# Patient Record
Sex: Male | Born: 1950 | Hispanic: Yes | Marital: Married | Smoking: Current some day smoker
Health system: Southern US, Community
[De-identification: ages and names within clinical notes are randomized; demographics above are authoritative.]

---

## 2005-01-19 ENCOUNTER — Emergency Department (HOSPITAL_COMMUNITY): Admission: EM | Admit: 2005-01-19 | Discharge: 2005-01-19 | Payer: Self-pay | Admitting: Emergency Medicine

## 2016-08-08 ENCOUNTER — Inpatient Hospital Stay (HOSPITAL_COMMUNITY)
Admission: EM | Admit: 2016-08-08 | Discharge: 2016-08-09 | DRG: 395 | Disposition: A | Discharge status: Home / Self Care | Payer: Self-pay | Attending: Surgery | Admitting: Surgery

## 2016-08-08 ENCOUNTER — Encounter (HOSPITAL_COMMUNITY): Payer: Self-pay

## 2016-08-08 ENCOUNTER — Emergency Department (HOSPITAL_COMMUNITY): Payer: Self-pay

## 2016-08-08 DIAGNOSIS — Z880 Allergy status to penicillin: Secondary | ICD-10-CM

## 2016-08-08 DIAGNOSIS — T185XXA Foreign body in anus and rectum, initial encounter: Principal | ICD-10-CM | POA: Diagnosis present

## 2016-08-08 DIAGNOSIS — F17210 Nicotine dependence, cigarettes, uncomplicated: Secondary | ICD-10-CM | POA: Diagnosis present
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
## 2016-08-08 NOTE — ED Notes (Signed)
Patient transported to X-ray 
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
## 2016-08-09 ENCOUNTER — Emergency Department (HOSPITAL_COMMUNITY): Payer: Self-pay | Admitting: Anesthesiology

## 2016-08-09 ENCOUNTER — Encounter (HOSPITAL_COMMUNITY): Admission: EM | Disposition: A | Discharge status: Home / Self Care | Payer: Self-pay | Source: Home / Self Care

## 2016-08-09 ENCOUNTER — Encounter (HOSPITAL_COMMUNITY): Payer: Self-pay | Admitting: Anesthesiology

## 2016-08-09 DIAGNOSIS — T185XXA Foreign body in anus and rectum, initial encounter: Secondary | ICD-10-CM | POA: Diagnosis present

## 2016-08-09 HISTORY — PX: EVALUATION UNDER ANESTHESIA WITH ANAL FISTULECTOMY: SHX5621

## 2016-08-09 LAB — BASIC METABOLIC PANEL
Anion gap: 11 (ref 5–15)
CO2: 22 mmol/L (ref 22–32)
CREATININE: 0.82 mg/dL (ref 0.61–1.24)
Calcium: 9.3 mg/dL (ref 8.9–10.3)
Chloride: 105 mmol/L (ref 101–111)
Glucose, Bld: 120 mg/dL — ABNORMAL HIGH (ref 65–99)
Potassium: 3.8 mmol/L (ref 3.5–5.1)
SODIUM: 138 mmol/L (ref 135–145)

## 2016-08-09 LAB — CBC WITH DIFFERENTIAL/PLATELET
BASOS PCT: 0 %
Basophils Absolute: 0 10*3/uL (ref 0.0–0.1)
EOS ABS: 0 10*3/uL (ref 0.0–0.7)
EOS PCT: 0 %
HCT: 44.7 % (ref 39.0–52.0)
Hemoglobin: 15.6 g/dL (ref 13.0–17.0)
LYMPHS ABS: 1 10*3/uL (ref 0.7–4.0)
Lymphocytes Relative: 9 %
MCH: 32.5 pg (ref 26.0–34.0)
MCHC: 34.9 g/dL (ref 30.0–36.0)
MCV: 93.1 fL (ref 78.0–100.0)
MONOS PCT: 5 %
Monocytes Absolute: 0.5 10*3/uL (ref 0.1–1.0)
Neutro Abs: 9.9 10*3/uL — ABNORMAL HIGH (ref 1.7–7.7)
Neutrophils Relative %: 86 %
PLATELETS: 295 10*3/uL (ref 150–400)
RBC: 4.8 MIL/uL (ref 4.22–5.81)
RDW: 12.3 % (ref 11.5–15.5)
WBC: 11.4 10*3/uL — AB (ref 4.0–10.5)

## 2016-08-09 SURGERY — EXAM UNDER ANESTHESIA WITH ANAL FISTULECTOMY
Anesthesia: General | Site: Rectum

## 2016-08-09 MED ORDER — MIDAZOLAM HCL 2 MG/2ML IJ SOLN
INTRAMUSCULAR | Status: AC
  Filled 2016-08-09: qty 2

## 2016-08-09 MED ORDER — KCL IN DEXTROSE-NACL 20-5-0.9 MEQ/L-%-% IV SOLN
INTRAVENOUS | Status: DC
Start: 2016-08-09 — End: 2016-08-09
  Administered 2016-08-09: 03:00:00 via INTRAVENOUS
  Filled 2016-08-09 (×2): qty 1000

## 2016-08-09 MED ORDER — LIDOCAINE HCL (CARDIAC) 20 MG/ML IV SOLN
INTRAVENOUS | Status: DC | PRN
Start: 2016-08-09 — End: 2016-08-09
  Administered 2016-08-09: 100 mg via INTRATRACHEAL

## 2016-08-09 MED ORDER — ENOXAPARIN SODIUM 40 MG/0.4ML ~~LOC~~ SOLN: 40.0000 mg | SUBCUTANEOUS | Status: DC

## 2016-08-09 MED ORDER — SIMETHICONE 80 MG PO CHEW: 40.0000 mg | CHEWABLE_TABLET | Freq: Four times a day (QID) | ORAL | Status: DC | PRN

## 2016-08-09 MED ORDER — SODIUM CHLORIDE 0.9 % IV BOLUS (SEPSIS)
1000.0000 mL | Freq: Once | INTRAVENOUS | Status: AC
Start: 2016-08-09 — End: 2016-08-09
  Administered 2016-08-09: 1000 mL via INTRAVENOUS

## 2016-08-09 MED ORDER — SODIUM CHLORIDE 0.9 % IV SOLN
INTRAVENOUS | Status: DC | PRN
  Administered 2016-08-09: 01:00:00 via INTRAVENOUS

## 2016-08-09 MED ORDER — ONDANSETRON HCL 4 MG/2ML IJ SOLN: 4.0000 mg | Freq: Four times a day (QID) | INTRAMUSCULAR | Status: DC | PRN

## 2016-08-09 MED ORDER — ONDANSETRON 4 MG PO TBDP: 4.0000 mg | ORAL_TABLET | Freq: Four times a day (QID) | ORAL | Status: DC | PRN

## 2016-08-09 MED ORDER — HYDROCODONE-ACETAMINOPHEN 7.5-325 MG PO TABS: 1.0000 | ORAL_TABLET | Freq: Once | ORAL | Status: DC | PRN

## 2016-08-09 MED ORDER — PROPOFOL 10 MG/ML IV BOLUS
INTRAVENOUS | Status: DC | PRN
  Administered 2016-08-09: 160 mg via INTRAVENOUS
  Administered 2016-08-09: 50 mg via INTRAVENOUS

## 2016-08-09 MED ORDER — FENTANYL CITRATE (PF) 100 MCG/2ML IJ SOLN
INTRAMUSCULAR | Status: AC
Start: 2016-08-09 — End: 2016-08-09
  Filled 2016-08-09: qty 2

## 2016-08-09 MED ORDER — HYDROMORPHONE HCL 1 MG/ML IJ SOLN: 0.2500 mg | INTRAMUSCULAR | Status: DC | PRN

## 2016-08-09 MED ORDER — OXYCODONE HCL 5 MG PO TABS: 5.0000 mg | ORAL_TABLET | ORAL | Status: DC | PRN

## 2016-08-09 MED ORDER — IBUPROFEN 600 MG PO TABS: 600.0000 mg | ORAL_TABLET | Freq: Four times a day (QID) | ORAL | Status: DC | PRN

## 2016-08-09 MED ORDER — SODIUM CHLORIDE 0.9 % IV SOLN
500.0000 mg | Freq: Once | INTRAVENOUS | Status: AC
  Administered 2016-08-09: 500 mg via INTRAVENOUS
  Filled 2016-08-09: qty 500

## 2016-08-09 MED ORDER — SUCCINYLCHOLINE CHLORIDE 20 MG/ML IJ SOLN
INTRAMUSCULAR | Status: DC | PRN
  Administered 2016-08-09: 80 mg via INTRAVENOUS

## 2016-08-09 MED ORDER — ONDANSETRON HCL 4 MG/2ML IJ SOLN
4.0000 mg | Freq: Once | INTRAMUSCULAR | Status: DC
Start: 2016-08-09 — End: 2016-08-09

## 2016-08-09 MED ORDER — PROPOFOL 10 MG/ML IV BOLUS
INTRAVENOUS | Status: AC
  Filled 2016-08-09: qty 20

## 2016-08-09 MED ORDER — KCL IN DEXTROSE-NACL 20-5-0.9 MEQ/L-%-% IV SOLN
INTRAVENOUS | Status: DC
  Administered 2016-08-09: 02:00:00 via INTRAVENOUS
  Filled 2016-08-09 (×3): qty 1000

## 2016-08-09 MED ORDER — ONDANSETRON HCL 4 MG/2ML IJ SOLN: 4.0000 mg | Freq: Once | INTRAMUSCULAR | Status: DC | PRN

## 2016-08-09 MED ORDER — FENTANYL CITRATE (PF) 250 MCG/5ML IJ SOLN
INTRAMUSCULAR | Status: DC | PRN
  Administered 2016-08-09 (×2): 50 ug via INTRAVENOUS

## 2016-08-09 SURGICAL SUPPLY — 37 items
CANISTER SUCTION 2500CC (MISCELLANEOUS) ×3 IMPLANT
COVER SURGICAL LIGHT HANDLE (MISCELLANEOUS) ×3 IMPLANT
DRAPE UTILITY XL STRL (DRAPES) ×2 IMPLANT
DRSG PAD ABDOMINAL 8X10 ST (GAUZE/BANDAGES/DRESSINGS) ×1 IMPLANT
ELECT CAUTERY BLADE 6.4 (BLADE) ×1 IMPLANT
ELECT REM PT RETURN 9FT ADLT (ELECTROSURGICAL)
ELECTRODE REM PT RTRN 9FT ADLT (ELECTROSURGICAL) ×1 IMPLANT
GAUZE SPONGE 4X4 12PLY STRL (GAUZE/BANDAGES/DRESSINGS) ×1 IMPLANT
GAUZE SPONGE 4X4 16PLY XRAY LF (GAUZE/BANDAGES/DRESSINGS) ×1 IMPLANT
GEL ULTRASOUND 20GR AQUASONIC (MISCELLANEOUS) ×1 IMPLANT
GLOVE BIO SURGEON STRL SZ8 (GLOVE) ×3 IMPLANT
GLOVE BIOGEL PI IND STRL 8 (GLOVE) ×1 IMPLANT
GLOVE BIOGEL PI INDICATOR 8 (GLOVE) ×2
GOWN STRL REUS W/ TWL LRG LVL3 (GOWN DISPOSABLE) ×2 IMPLANT
GOWN STRL REUS W/ TWL XL LVL3 (GOWN DISPOSABLE) ×1 IMPLANT
GOWN STRL REUS W/TWL LRG LVL3 (GOWN DISPOSABLE) ×3
GOWN STRL REUS W/TWL XL LVL3 (GOWN DISPOSABLE) ×3
KIT BASIN OR (CUSTOM PROCEDURE TRAY) ×3 IMPLANT
KIT ROOM TURNOVER OR (KITS) ×3 IMPLANT
NDL HYPO 25GX1X1/2 BEV (NEEDLE) ×1 IMPLANT
NEEDLE HYPO 25GX1X1/2 BEV (NEEDLE) IMPLANT
NS IRRIG 1000ML POUR BTL (IV SOLUTION) ×1 IMPLANT
PACK LITHOTOMY IV (CUSTOM PROCEDURE TRAY) ×3 IMPLANT
PAD ARMBOARD 7.5X6 YLW CONV (MISCELLANEOUS) ×6 IMPLANT
PENCIL BUTTON HOLSTER BLD 10FT (ELECTRODE) ×1 IMPLANT
SURGILUBE 2OZ TUBE FLIPTOP (MISCELLANEOUS) ×3 IMPLANT
SUT CHROMIC 2 0 SH (SUTURE) IMPLANT
SYR BULB 3OZ (MISCELLANEOUS) ×1 IMPLANT
SYR CONTROL 10ML LL (SYRINGE) ×1 IMPLANT
TOWEL OR 17X24 6PK STRL BLUE (TOWEL DISPOSABLE) ×3 IMPLANT
TOWEL OR 17X26 10 PK STRL BLUE (TOWEL DISPOSABLE) ×3 IMPLANT
TRAY PROCTOSCOPIC FIBER OPTIC (SET/KITS/TRAYS/PACK) ×2 IMPLANT
TUBE CONNECTING 12'X1/4 (SUCTIONS) ×1
TUBE CONNECTING 12X1/4 (SUCTIONS) ×2 IMPLANT
UNDERPAD 30X30 (UNDERPADS AND DIAPERS) ×1 IMPLANT
WATER STERILE IRR 1000ML POUR (IV SOLUTION) IMPLANT
YANKAUER SUCT BULB TIP NO VENT (SUCTIONS) ×3 IMPLANT

## 2016-08-09 NOTE — Anesthesia Procedure Notes (Signed)
Procedure Name: Intubation Date/Time: 08/09/2016 1:28 AM Performed by: Brien MatesMAHONY, Kentavious Michele D Pre-anesthesia Checklist: Patient identified, Emergency Drugs available, Suction available, Patient being monitored and Timeout performed Patient Re-evaluated:Patient Re-evaluated prior to inductionOxygen Delivery Method: Circle system utilized Preoxygenation: Pre-oxygenation with 100% oxygen Intubation Type: IV induction, Rapid sequence and Cricoid Pressure applied Laryngoscope Size: Miller and 2 Grade View: Grade I Tube type: Oral Tube size: 7.5 mm Number of attempts: 1 Airway Equipment and Method: Stylet Placement Confirmation: ETT inserted through vocal cords under direct vision,  positive ETCO2 and breath sounds checked- equal and bilateral Secured at: 22 cm Tube secured with: Tape Dental Injury: Teeth and Oropharynx as per pre-operative assessment

## 2016-08-09 NOTE — Discharge Summary (Signed)
Physician Discharge Summary  Patient ID: Clovis FredricksonJose Saephan MRN: [id number] DOB/AGE: 67-May-1952 67 y.o.  Admit date: 08/08/2016 Discharge date: 08/09/2016  Admission Diagnoses:  Discharge Diagnoses:  Active Problems:   Rectal foreign body   Foreign body anus/rectum   Discharged Condition: good  Hospital Course: uneventful post op recovery.  Discharged pod#1  Consults: None  Significant Diagnostic Studies:   Treatments: surgery: removal foreign body from rectum  Discharge Exam: Blood pressure (!) 126/59, pulse 73, temperature 97.8 F (36.6 C), temperature source Oral, resp. rate 16, weight 75.2 kg (165 lb 12.8 oz), SpO2 97 %. General appearance: alert, cooperative and no distress Resp: clear to auscultation bilaterally Cardio: regular rate and rhythm, S1, S2 normal, no murmur, click, rub or gallop GI: soft, non-tender; bowel sounds normal; no masses,  no organomegaly  Disposition: Final discharge disposition not confirmed     Medication List    You have not been prescribed any medications.    Follow-up Information    [facility] Follow up today.   Specialty:  General Surgery Why:  as needed Contact information: [address] [place] [id number] [phone]          Signed: Shelly RubensteinBLACKMAN,Lynnel Zanetti A 08/09/2016, 8:01 AM

## 2016-08-09 NOTE — ED Provider Notes (Signed)
Medical screening examination/treatment/procedure(s) were conducted as a shared visit with non-physician practitioner(s) and myself.  I personally evaluated the patient during the encounter.  Seen and examined with Christian Moon see her HPI  By signing my name below, I, Christian Moon, attest that this documentation has been prepared under the direction and in the presence of Guida Asman, MD.  Electronically Signed: Suzan SlickAshley N. Elon Moon, ED Scribe. 08/09/16. 12:19 AM.   HPI Comments: Christian Moon is a 65 y.o. male who presents to the Emergency Department complaining of rectal foreign body  PCP: No primary care provider on file.     BP 146/84 (BP Location: Left Arm)   Pulse 91   Temp 98.2 F (36.8 C) (Oral)   Resp 18   Wt 170 lb (77.1 kg)   SpO2 98%   CONSTITUTIONAL: Well developed/well nourished HEAD: Normocephalic/atraumatic ENMT: Mucous membranes dry NECK: supple no meningeal signs. No bruits noted. No stridor SPINE/BACK:entire spine nontender CV: RRR, no murmurs/rubs/gallops noted. Intact distal pulses LUNGS: Lungs are clear to auscultation bilaterally, no apparent distress ABDOMEN: soft, distended, and hypoactive. No guarding or rebound noted. NEURO: Pt is awake/alert/appropriate, moves all extremitiesx4.  No facial droop. DTRs normal  EXTREMITIES: pulses normal/equal, full ROM SKIN: warm, color normal  Results for orders placed or performed during the hospital encounter of 08/08/16  CBC with Differential/Platelet  Result Value Ref Range   WBC 11.4 (H) 4.0 - 10.5 K/uL   RBC 4.80 4.22 - 5.81 MIL/uL   Hemoglobin 15.6 13.0 - 17.0 g/dL   HCT 30.844.7 65.739.0 - 84.652.0 %   MCV 93.1 78.0 - 100.0 fL   MCH 32.5 26.0 - 34.0 pg   MCHC 34.9 30.0 - 36.0 g/dL   RDW 96.212.3 95.211.5 - 84.115.5 %   Platelets 295 150 - 400 K/uL   Neutrophils Relative % 86 %   Neutro Abs 9.9 (H) 1.7 - 7.7 K/uL   Lymphocytes Relative 9 %   Lymphs Abs 1.0 0.7 - 4.0 K/uL   Monocytes Relative 5 %   Monocytes Absolute  0.5 0.1 - 1.0 K/uL   Eosinophils Relative 0 %   Eosinophils Absolute 0.0 0.0 - 0.7 K/uL   Basophils Relative 0 %   Basophils Absolute 0.0 0.0 - 0.1 K/uL   Dg Abdomen Acute W/chest  Result Date: 08/09/2016 CLINICAL DATA:  Initial evaluation for rectal foreign body. EXAM: DG ABDOMEN ACUTE W/ 1V CHEST COMPARISON:  None available. FINDINGS: Cardiac and mediastinal silhouettes are within normal limits. Lungs hypoinflated. Secondary diffuse bronchovascular crowding. No focal infiltrate, pulmonary edema, or pleural effusion. No pneumothorax. Large retained foreign body overlies the rectum/pelvis, consistent with a bottle. Bowel gas pattern within normal limits without evidence for obstruction. Moderate to large amount of retained stool within the colon proximally. No free air. No soft tissue mass or abnormal calcification. No acute osseus abnormality. Scattered degenerative changes noted within the lower lumbar spine. 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 IMPRESSION: 1. Retained foreign body as above. No associated obstruction. No free air. 2. Shallow lung inflation with mild bronchovascular crowding. No active cardiopulmonary disease. The Electronically Signed   By: Rise MuBenjamin  McClintock M.D.   On: 08/09/2016 00:13   Medications  imipenem-cilastatin (PRIMAXIN) 500 mg in sodium chloride 0.9 % 100 mL IVPB (500 mg Intravenous Transfusing/Transfer 08/09/16 0042)  ondansetron (ZOFRAN) injection 4 mg (not administered)  sodium chloride 0.9 % bolus 1,000 mL (1,000 mLs Intravenous Transfusing/Transfer 08/09/16 0042)    IV abx and to OR   Christian Moon  Christian AlconPalumbo, MD 08/09/16 [id number]

## 2016-08-09 NOTE — Transfer of Care (Signed)
Immediate Anesthesia Transfer of Care Note  Patient: Christian FredricksonJose Jungbluth  Procedure(s) Performed: Procedure(s): EXAM UNDER ANESTHESIA WITH REMOVAL OF FOREIGN BODY (N/A)  Patient Location: PACU  Anesthesia Type:General  Level of Consciousness: sedated  Airway & Oxygen Therapy: Patient Spontanous Breathing and Patient connected to face mask oxygen  Post-op Assessment: Report given to RN and Post -op Vital signs reviewed and stable  Post vital signs: Reviewed and stable  Last Vitals:  Vitals:   08/08/16 2227 08/09/16 0049  BP: 146/84 167/88  Pulse: 91 88  Resp: 18 18  Temp: 36.8 C     Last Pain:  Vitals:   08/08/16 2227  TempSrc: Oral         Complications: No apparent anesthesia complications

## 2016-08-09 NOTE — Progress Notes (Signed)
SW was notified by nurse that pt needs assistance with transportation. However, SW consulted with Spanish Interpreter to have her inquire about transportation home for patient.  Patient informed interpreter that his son is waiting outside for him and will take him home. SW made nurse aware. Patient has no no questions for SW.  Crista CurbBrittney Jasey Cortez, MSW [phone]

## 2016-08-09 NOTE — Progress Notes (Signed)
D/C papers gone over with pt. Via phone interpreter. IV taken out. Pt. D/c'd home with his son. No questions/complaints!

## 2016-08-09 NOTE — Progress Notes (Signed)
Interpreter services utilized for patient initial assessment ( interpreter EbroJason [phone])
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
## 2016-08-09 NOTE — ED Notes (Signed)
Dr. Luisa Hartornett in to assess and talk to pt using interpreter phone.  Procedure for surg explained to pt. And pt voices understanding.  No questions asked by pt.  Pt's son was here and st's he will be back in the am.
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
## 2016-08-09 NOTE — Discharge Instructions (Signed)
[facility]__[facility]  RECTAL SURGERY POST OP INSTRUCTIONS: POST OP INSTRUCTIONS  Always review your discharge instruction sheet given to you by the facility where your surgery was performed. IF YOU HAVE DISABILITY OR FAMILY LEAVE FORMS, YOU MUST BRING THEM TO THE OFFICE FOR PROCESSING.   DO NOT GIVE THEM TO YOUR DOCTOR.  1. A  prescription for pain medication may be given to you upon discharge.  Take your pain medication as prescribed, if needed.  If narcotic pain medicine is not needed, then you may take acetaminophen (Tylenol) or ibuprofen (Advil) as needed. 2. Take your usually prescribed medications unless otherwise directed. 3. If you need a refill on your pain medication, please contact your pharmacy.  They will contact our office to request authorization. Prescriptions will not be filled after 5 pm or on week-ends. 4. You should follow a light diet the first 48 hours after arrival home, such as soup and crackers, etc.  Be sure to include lots of fluids daily.  Resume your normal diet 2-3 days after surgery.. 5. Most patients will experience some swelling and discomfort in the rectal area. Ice packs, reclining and warm tub soaks will help.  Swelling and discomfort can take several days to resolve.  6. It is common to experience some constipation if taking pain medication after surgery.  Increasing fluid intake and taking a stool softener (such as Colace) will usually help or prevent this problem from occurring.  A mild laxative (Milk of Magnesia or Miralax) should be taken according to package directions if there are no bowel movements after 48 hours. 7. Unless discharge instructions indicate otherwise, leave your bandage dry and in place for 24 hours, or remove the bandage if you have a bowel movement. You may notice a small amount of bleeding with bowel movements for the first few days. You may have some packing in the rectum which will come out over the first day or two. You  will need to wear an absorbent pad or soft cotton gauze in your underwear until the drainage stops.it. 8. ACTIVITIES:  You may resume regular (light) daily activities beginning the next day--such as daily self-care, walking, climbing stairs--gradually increasing activities as tolerated.  You may have sexual intercourse when it is comfortable.  Refrain from any heavy lifting or straining until approved by your doctor. a. You may drive when you are no longer taking prescription pain medication, you can comfortably wear a seatbelt, and you can safely maneuver your car and apply brakes. b. RETURN TO WORK: : _today___________________ c.  9. You should see your doctor in the office for a follow-up appointment approximately 2-3 weeks after your surgery.  Make sure that you call for this appointment within a day or two after you arrive home to insure a convenient appointment time. 10. OTHER INSTRUCTIONS:  __________________________________________________________________________________________________________________________________________________________________________________________  WHEN TO CALL YOUR DOCTOR: 1. Fever over 101.0 2. Inability to urinate 3. Nausea and/or vomiting 4. Extreme swelling or bruising 5. Continued bleeding from rectum. 6. Increased pain, redness, or drainage from the incision 7. Constipation  The clinic staff is available to answer your questions during regular business hours.  Please dont hesitate to call and ask to speak to one of the nurses for clinical concerns.  If you have a medical emergency, go to the nearest emergency room or call 911.  A surgeon from [facility] is always on call at the hospital   [address], [address], [place]  [id number] ?  [address] [address], [place], [place]   [address] °[phone] ? [phone] ? FAX [phone] °Web site: [email or web address] ° °

## 2016-08-09 NOTE — Anesthesia Postprocedure Evaluation (Signed)
Anesthesia Post Note  Patient: Christian Moon  Procedure(s) Performed: Procedure(s) (LRB): EXAM UNDER ANESTHESIA WITH REMOVAL OF FOREIGN BODY (N/A)  Patient location during evaluation: PACU Anesthesia Type: General Level of consciousness: awake and alert Pain management: pain level controlled Vital Signs Assessment: post-procedure vital signs reviewed and stable Respiratory status: spontaneous breathing, nonlabored ventilation, respiratory function stable and patient connected to nasal cannula oxygen Cardiovascular status: blood pressure returned to baseline and stable Postop Assessment: no signs of nausea or vomiting Anesthetic complications: no    Last Vitals:  Vitals:   08/09/16 0245 08/09/16 0630  BP: (!) 142/71 (!) 126/59  Pulse: 77 73  Resp: 16 16  Temp: 36.4 C 36.6 C    Last Pain:  Vitals:   08/09/16 0630  TempSrc: Oral                 Christian Moon, Christian Moon

## 2016-08-09 NOTE — ED Notes (Signed)
Pt to OR at this time.

## 2016-08-09 NOTE — ED Notes (Signed)
All pt's belongings with pt to ED
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
## 2016-08-09 NOTE — Anesthesia Preprocedure Evaluation (Addendum)
Anesthesia Evaluation  Patient identified by MRN, date of birth, ID band Patient awake    Reviewed: Allergy & Precautions, NPO status , Patient's Chart, lab work & pertinent test results  Airway Mallampati: II  TM Distance: >3 FB Neck ROM: Full    Dental  (+) Poor Dentition, Chipped, Loose, Missing, Dental Advisory Given   Pulmonary Current Smoker,    Pulmonary exam normal breath sounds clear to auscultation       Cardiovascular Normal cardiovascular exam Rhythm:Regular Rate:Normal     Neuro/Psych    GI/Hepatic (+)     substance abuse  cocaine use,   Endo/Other    Renal/GU      Musculoskeletal   Abdominal   Peds  Hematology   Anesthesia Other Findings   Reproductive/Obstetrics                            Anesthesia Physical Anesthesia Plan  ASA: II and emergent  Anesthesia Plan: General   Post-op Pain Management:    Induction: Intravenous, Rapid sequence and Cricoid pressure planned  Airway Management Planned: Oral ETT  Additional Equipment:   Intra-op Plan:   Post-operative Plan: Extubation in OR  Informed Consent: I have reviewed the patients History and Physical, chart, labs and discussed the procedure including the risks, benefits and alternatives for the proposed anesthesia with the patient or authorized representative who has indicated his/her understanding and acceptance.   Dental advisory given  Plan Discussed with: CRNA, Anesthesiologist and Surgeon  Anesthesia Plan Comments:         Anesthesia Quick Evaluation

## 2016-08-10 ENCOUNTER — Encounter (HOSPITAL_COMMUNITY): Payer: Self-pay | Admitting: Surgery
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
